# Patient Record
Sex: Female | Born: 1985 | Race: Black or African American | Hispanic: No | Marital: Married | Smoking: Never smoker
Health system: Southern US, Community
[De-identification: ages and names within clinical notes are randomized; demographics above are authoritative.]

## PROBLEM LIST (undated history)

## (undated) DIAGNOSIS — D573 Sickle-cell trait: Secondary | ICD-10-CM

## (undated) DIAGNOSIS — E0590 Thyrotoxicosis, unspecified without thyrotoxic crisis or storm: Secondary | ICD-10-CM

## (undated) HISTORY — DX: Sickle-cell trait: D57.3

## (undated) HISTORY — DX: Thyrotoxicosis, unspecified without thyrotoxic crisis or storm: E05.90

## (undated) HISTORY — PX: TUBAL LIGATION: SHX77

---
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
## 2004-01-14 ENCOUNTER — Emergency Department (HOSPITAL_COMMUNITY): Admission: EM | Admit: 2004-01-14 | Discharge: 2004-01-14 | Payer: Self-pay | Admitting: Emergency Medicine

## 2004-09-09 ENCOUNTER — Other Ambulatory Visit: Admission: RE | Admit: 2004-09-09 | Discharge: 2004-09-09 | Payer: Self-pay | Admitting: Gynecology

## 2004-10-17 ENCOUNTER — Emergency Department (HOSPITAL_COMMUNITY): Admission: EM | Admit: 2004-10-17 | Discharge: 2004-10-17 | Payer: Self-pay | Admitting: Emergency Medicine

## 2004-10-21 ENCOUNTER — Observation Stay (HOSPITAL_COMMUNITY): Admission: AD | Admit: 2004-10-21 | Discharge: 2004-10-22 | Payer: Self-pay | Admitting: Obstetrics and Gynecology

## 2005-04-29 ENCOUNTER — Ambulatory Visit: Payer: Self-pay | Admitting: Obstetrics & Gynecology

## 2005-04-29 ENCOUNTER — Ambulatory Visit (HOSPITAL_COMMUNITY): Admission: RE | Admit: 2005-04-29 | Discharge: 2005-04-29 | Payer: Self-pay | Admitting: Obstetrics and Gynecology

## 2005-05-01 ENCOUNTER — Inpatient Hospital Stay (HOSPITAL_COMMUNITY): Admission: AD | Admit: 2005-05-01 | Discharge: 2005-05-03 | Payer: Self-pay | Admitting: *Deleted

## 2005-09-22 ENCOUNTER — Other Ambulatory Visit: Admission: RE | Admit: 2005-09-22 | Discharge: 2005-09-22 | Payer: Self-pay | Admitting: Obstetrics and Gynecology

## 2006-09-28 ENCOUNTER — Other Ambulatory Visit: Admission: RE | Admit: 2006-09-28 | Discharge: 2006-09-28 | Payer: Self-pay | Admitting: Obstetrics and Gynecology

## 2007-08-11 ENCOUNTER — Ambulatory Visit (HOSPITAL_COMMUNITY): Admission: RE | Admit: 2007-08-11 | Discharge: 2007-08-11 | Payer: Self-pay | Admitting: Obstetrics and Gynecology

## 2008-02-09 ENCOUNTER — Inpatient Hospital Stay (HOSPITAL_COMMUNITY): Admission: AD | Admit: 2008-02-09 | Discharge: 2008-02-09 | Payer: Self-pay | Admitting: Obstetrics and Gynecology

## 2008-02-15 ENCOUNTER — Inpatient Hospital Stay (HOSPITAL_COMMUNITY): Admission: AD | Admit: 2008-02-15 | Discharge: 2008-02-18 | Payer: Self-pay | Admitting: Obstetrics and Gynecology

## 2010-06-21 ENCOUNTER — Inpatient Hospital Stay (HOSPITAL_COMMUNITY): Admission: AD | Admit: 2010-06-21 | Discharge: 2010-06-21 | Payer: Self-pay | Admitting: Obstetrics and Gynecology

## 2010-07-13 ENCOUNTER — Encounter: Payer: Self-pay | Admitting: Endocrinology

## 2010-08-25 ENCOUNTER — Ambulatory Visit: Payer: Self-pay | Admitting: Endocrinology

## 2010-08-25 DIAGNOSIS — E0590 Thyrotoxicosis, unspecified without thyrotoxic crisis or storm: Secondary | ICD-10-CM | POA: Insufficient documentation

## 2010-08-25 LAB — CONVERTED CEMR LAB
Free T4: 0.67 ng/dL (ref 0.60–1.60)
TSH: 0.28 microintl units/mL — ABNORMAL LOW (ref 0.35–5.50)

## 2010-12-17 ENCOUNTER — Inpatient Hospital Stay (HOSPITAL_COMMUNITY)
Admission: AD | Admit: 2010-12-17 | Discharge: 2010-12-20 | Payer: Self-pay | Source: Home / Self Care | Attending: Obstetrics and Gynecology | Admitting: Obstetrics and Gynecology

## 2010-12-19 ENCOUNTER — Encounter (INDEPENDENT_AMBULATORY_CARE_PROVIDER_SITE_OTHER): Payer: Self-pay | Admitting: Obstetrics and Gynecology

## 2010-12-21 LAB — CBC
HCT: 28.5 % — ABNORMAL LOW (ref 36.0–46.0)
Hemoglobin: 9.6 g/dL — ABNORMAL LOW (ref 12.0–15.0)
MCH: 23.8 pg — ABNORMAL LOW (ref 26.0–34.0)
MCHC: 33.7 g/dL (ref 30.0–36.0)
MCV: 70.5 fL — ABNORMAL LOW (ref 78.0–100.0)
Platelets: 212 10*3/uL (ref 150–400)
RBC: 4.04 MIL/uL (ref 3.87–5.11)
RDW: 16.3 % — ABNORMAL HIGH (ref 11.5–15.5)
WBC: 8.8 10*3/uL (ref 4.0–10.5)

## 2010-12-21 LAB — RPR: RPR Ser Ql: NONREACTIVE

## 2010-12-22 LAB — CBC
HCT: 27.5 % — ABNORMAL LOW (ref 36.0–46.0)
Hemoglobin: 9.1 g/dL — ABNORMAL LOW (ref 12.0–15.0)
MCH: 23.9 pg — ABNORMAL LOW (ref 26.0–34.0)
MCHC: 33.1 g/dL (ref 30.0–36.0)
MCV: 72.2 fL — ABNORMAL LOW (ref 78.0–100.0)
Platelets: 158 10*3/uL (ref 150–400)
RBC: 3.81 MIL/uL — ABNORMAL LOW (ref 3.87–5.11)
RDW: 15.9 % — ABNORMAL HIGH (ref 11.5–15.5)
WBC: 14.1 10*3/uL — ABNORMAL HIGH (ref 4.0–10.5)

## 2010-12-22 LAB — ABO/RH: ABO/RH(D): A POS

## 2010-12-24 NOTE — Op Note (Addendum)
  NAMESULMA, RUFFINO                  ACCOUNT NO.:  [id number]  MEDICAL RECORD NO.:  [id number]          PATIENT TYPE:  INP  LOCATION:  9108                          [facility]  [facility]  PHYSICIAN:  Hal Morales, M.D.DATE OF BIRTH:  [birth year]  DATE OF PROCEDURE:  12/19/2010 DATE OF DISCHARGE:                              OPERATIVE REPORT   PREOPERATIVE DIAGNOSES:  Desire for surgical sterilization, status post twin vaginal birth after cesarean section.  POSTOPERATIVE DIAGNOSES:  Desire for surgical sterilization, status post twin vaginal birth after cesarean section.  OPERATION:  Postpartum tubal sterilization.  SURGEON:  Vanessa P. Haygood, MD  ANESTHESIA:  Epidural.  ESTIMATED BLOOD LOSS:  Less than 10 mL.  COMPLICATIONS:  None.  FINDINGS:  The tubes appeared normal for the postpartum state.  DESCRIPTION OF PROCEDURE:  The patient was taken to the operating room after appropriate identification and placed on the operating table. After dosing her labor epidural for surgical anesthesia she was left in the supine position.  The abdomen was prepped with ChloraPrep and the perineum prepped with Betadine for insertion of a Foley catheter under sterile conditions.  The abdomen was draped as sterile field.  After assurance of adequate anesthesia, the subumbilical region was injected with 10 mL of 0.25% Marcaine.  A subumbilical incision was made and the peritoneum entered bluntly.  The left fallopian tube was identified, followed to its fimbriated end, then grasped at the isthmic portion and elevated.  A suture of 2-0 chromic was placed through the mesosalpinx and tied fore-and-aft on the knuckle of tube.  A second ligature was placed proximal to that and the intervening knuckle of tube was excised. The cut ends were cauterized.  A similar procedure was carried out on the opposite side.  Hemostasis was noted to be adequate.  The abdominal peritoneum and fascia were closed in a  single layer with a running suture of 0 Vicryl.  The skin incision was closed with a subcuticular suture of 3-0 Vicryl.  A sterile dressing was applied and the patient was taken from the operating room to the recovery room in satisfactory condition having tolerated the procedure well with sponge and instrument counts correct.  SPECIMENS TO PATHOLOGY:  Portions of right and left fallopian tube.     Hal Morales, M.D.     VPH/MEDQ  D:  12/19/2010  T:  12/19/2010  Job:  [id number]  Electronically Signed by Dierdre Forth M.D. on 12/24/2010 03:31:26 PM

## 2010-12-29 NOTE — Letter (Signed)
Summary: [employer] for [facility]   Imported By: Sherian Rein 08/28/2010 13:03:47  _____________________________________________________________________  External Attachment:    Type:   Image     Comment:   External Document

## 2010-12-29 NOTE — Assessment & Plan Note (Signed)
Summary: NEW ENDO CON/ [employer] / HYPOTHYROID/ LOW TSH/NWS  #   Vital Signs:  Patient profile:   25 year old female LMP:     04/08/2010 Height:      66 inches (167.64 cm) Weight:      214 pounds (97.27 kg) BMI:     34.67 O2 Sat:      98 % on Room air Temp:     98.3 degrees F (36.83 degrees C) oral Pulse rate:   125 / minute BP sitting:   110 / 72  (left arm) Cuff size:   large  Vitals Entered By: Brenton Grills MA (August 25, 2010 3:55 PM)  O2 Flow:  Room air CC: New Endo/Low TSH/Hypothyroidism/Dr. Rivard/aj LMP (date): 04/08/2010     Enter LMP: 04/08/2010   Referring Provider:  Silverio Lay MD Primary Provider:  Maryelizabeth Rowan MD  CC:  New Endo/Low TSH/Hypothyroidism/Dr. Rivard/aj.  History of Present Illness: pt was dx'ed with mild hyperthyroidism approx 2006, during a pregnancy.  as it was mild, she did not require any treatment.  she was followed for this also during another pregnancy in 2009, and did not require any treatment then, either.   she is now at 19+6 gestation,  and pt says this pregnancy is going well.  early in the pregnancy, she lost 12 lbs, and has since regained that many. n/v has improved, but persists.  Current Medications (verified): 1)  Promethazine Hcl 25 Mg Tabs (Promethazine Hcl) .Marland Kitchen.. 1 Tablet Every 6 Hours As Needed 2)  Zofran 4 Mg Tabs (Ondansetron Hcl) .Marland Kitchen.. 1 Tablet Every 4 Hours As Needed  Allergies (verified): No Known Drug Allergies  Past History:  Past Medical History: HYPERTHYROIDISM (ICD-242.90) FAMILY HISTORY DIABETES 1ST DEGREE RELATIVE (ICD-V18.0)  Past Surgical History: Caesarean section-2009  Family History: Reviewed history and no changes required. Family History of Arthritis (Grandparent) Family History Hypertension (Grandparent) Family History Diabetes 1st degree relative (Parent, Grandparent, Other Blood Relative) thyroid: none  Social History: Reviewed history and no changes required. 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  she has fatigue   Physical Exam  General:  obese.  no distress  Head:  head: no deformity eyes: no periorbital swelling, no proptosis external nose and ears are normal mouth: no lesion seen  Mouth:  Oropharynx is clear; good dentition, with no thrush or active dental infection.  Neck:  thyroid is perhaps slightly and diffusely enlarged.  no nodule Lungs:  Clear to auscultation bilaterally. Normal respiratory effort.  Heart:  tachycardic rate and normal rhythm without murmurs or gallops noted. Normal S1,S2.   Abdomen:  abdomen is soft, nontender.  no hepatosplenomegaly.   not distended.  no hernia obese Msk:  muscle bulk and strength are grossly normal.  no obvious joint swelling.  gait is normal and steady  Pulses:  dorsalis pedis intact bilat.  Extremities:  no deformity.  no ulcer on the feet.  feet are of normal color and temp.  no edema  Neurologic:  cn 2-12 grossly intact.   readily moves all 4's.   sensation is intact to touch on the feet no tremor Skin:  normal texture and temp.  no rash.  not diaphoretic  Cervical Nodes:  No significant adenopathy.  Psych:  Alert and cooperative; normal mood  and affect; normal attention span and concentration.   Additional Exam:  outside test results are reviewed:  tsh=0.004 (approx late aug 2011).    today: FastTSH              [L]  0.28 uIU/mL                 0.35-5.50 Free T4                   0.67 ng/dL         Impression & Recommendations:  Problem # 1:  HYPERTHYROIDISM (ICD-242.90) Assessment Improved  Problem # 2:  pregnancy so we'll have to watch #1 carefully  Problem # 3:  nausea could have been exac by #1  Medications Added to  Medication List This Visit: 1)  Promethazine Hcl 25 Mg Tabs (Promethazine hcl) .Marland Kitchen.. 1 tablet every 6 hours as needed 2)  Zofran 4 Mg Tabs (Ondansetron hcl) .Marland Kitchen.. 1 tablet every 4 hours as needed  Other Orders: TLB-TSH (Thyroid Stimulating Hormone) (84443-TSH) TLB-T4 (Thyrox), Free [phone]) Consultation Level IV (40981)  Patient Instructions: 1)  cc drs rivard and dewey.  2)  blood tests are being ordered for you today.  please call [phone] to hear your test results. 3)  Please schedule a follow-up appointment in 1 month. 4)  (update: i left message on phone-tree:  no rx needed now. ret 2 weeks.)

## 2010-12-29 NOTE — Discharge Summary (Signed)
  NAMEJODILYN, [name]                  ACCOUNT NO.:  [id number]  MEDICAL RECORD NO.:  [id number]          PATIENT TYPE:  INP  LOCATION:  9108                          [facility]  [facility]  PHYSICIAN:  Janine Limbo, M.D.DATE OF BIRTH:  [birth year]  DATE OF ADMISSION:  12/17/2010 DATE OF DISCHARGE:  12/20/2010                              DISCHARGE SUMMARY   ADMISSION DIAGNOSES: 1. A 36 week dichorionic diamniotic intrauterine pregnancy. 2. Preterm premature rupture of membranes. 3. Group B strep positive. 4. Desire sterilization. 5. Anemia.  DISCHARGE DIAGNOSES: 1. A 36 week dichorionic diamniotic intrauterine pregnancy. 2. Preterm premature rupture of membranes. 3. Group B strep positive. 4. Desire sterilization. 5. Anemia.  HOSPITAL PROCEDURES: 1. On December 18, 2010, the patient had a normal spontaneous     vaginal delivery of vertex vertex live born female infants.  Baby A     was 5 pounds 14 ounces born at 21:41, Apgars 8 and 9.  Baby B was 5     pounds 11 ounces born at 21:35 with Apgars of 8 and 9.  Placenta     was sent to Pathology. 2. Laparoscopic tubal sterilization performed on December 19, 2010 by     Dierdre Forth.  Portions of the right and left fallopian tube     were sent to Pathology.   HOSPITAL COUSRE: The patient had an uneventful course.  Her pain was  well-controlled with ibuprofen and Percocet.  She remained afebrile with stable vital signs throughout her stay.  Labs were as follows.  Her preop CBC showed white blood cell count of 8.8, hemoglobin of 9.6, hematocrit of 28.5, platelets of 212,000. Postpartum CBC showed white blood cell count of 14.1, hemoglobin of 9.1, hematocrit of 27.5, platelet count of 158,000.  RPR was nonreactive. Blood type A positive.  The patient was discharged home in stable condition on December 20, 2010, but did stay in the hospital as a baby-patient due to one of the babies experiencing jaundice and being under bili lights. She  was breast-feeding both babies well.  Both babies were circumcised.  DISCHARGE INSTRUCTIONS:  Per [facility] OB[facility] handout.  DISCHARGE MEDICATIONS:  Ibuprofen 600 mg p.o. q.6 p.r.n. for pain, dispensed 36 with 1 refill and Percocet 5/325 mg tablets 1-2 p.o. q.4 p.r.n. for pain, dispensed 36 with no refills.  The patient was encouraged to use stool softener while taking Percocet.  She was instructed to continue over-the- counter ferrous sulfate twice a day and increase dietary iron and fluids.  DISCHARGE FOLLOWUP:  At [facility] in 6 weeks or p.r.n.     Dorathy Kinsman, CNM   ______________________________ Janine Limbo, M.D.    VS/MEDQ  D:  12/24/2010  T:  12/24/2010  Job:  [id number]  Electronically Signed by Dorathy Kinsman  on 12/27/2010 04:38:35 PM Electronically Signed by Kirkland Hun M.D. on 12/29/2010 12:00:46 PM

## 2010-12-31 ENCOUNTER — Inpatient Hospital Stay: Admission: RE | Admit: 2010-12-31 | Payer: Self-pay | Source: Home / Self Care | Admitting: Obstetrics and Gynecology

## 2011-02-13 LAB — TSH: TSH: <0.004 u[IU]/mL — ABNORMAL LOW (ref 0.350–4.500)

## 2011-02-13 LAB — URINALYSIS, ROUTINE W REFLEX MICROSCOPIC
Bilirubin Urine: NEGATIVE
Glucose, UA: NEGATIVE mg/dL
Ketones, ur: NEGATIVE mg/dL
Nitrite: NEGATIVE
Protein, ur: NEGATIVE mg/dL
Specific Gravity, Urine: 1.015 (ref 1.005–1.030)
Urobilinogen, UA: 2 mg/dL — ABNORMAL HIGH (ref 0.0–1.0)
pH: 6.5 (ref 5.0–8.0)

## 2011-02-13 LAB — URINE MICROSCOPIC-ADD ON

## 2011-02-13 LAB — COMPREHENSIVE METABOLIC PANEL
ALT: 22 U/L (ref 0–35)
AST: 20 U/L (ref 0–37)
Albumin: 2.9 g/dL — ABNORMAL LOW (ref 3.5–5.2)
Alkaline Phosphatase: 82 U/L (ref 39–117)
BUN: 4 mg/dL — ABNORMAL LOW (ref 6–23)
CO2: 27 mEq/L (ref 19–32)
Calcium: 10.6 mg/dL — ABNORMAL HIGH (ref 8.4–10.5)
Chloride: 101 mEq/L (ref 96–112)
Creatinine, Ser: 0.57 mg/dL (ref 0.4–1.2)
GFR calc Af Amer: >60 mL/min (ref >60)
GFR calc non Af Amer: >60 mL/min (ref >60)
Glucose, Bld: 174 mg/dL — ABNORMAL HIGH (ref 70–99)
Potassium: 3.1 mEq/L — ABNORMAL LOW (ref 3.5–5.1)
Sodium: 134 mEq/L — ABNORMAL LOW (ref 135–145)
Total Bilirubin: 0.6 mg/dL (ref 0.3–1.2)
Total Protein: 6.2 g/dL (ref 6.0–8.3)

## 2011-02-13 LAB — URINE CULTURE
Colony Count: 30000
Culture  Setup Time: 201107242157

## 2011-04-13 NOTE — H&P (Signed)
NAMEQUANTA, ROBERTSHAW                  ACCOUNT NO.:  [id number]   MEDICAL RECORD NO.:  [id number]          PATIENT TYPE:  INP   LOCATION:  9164                          [facility]  [facility]   PHYSICIAN:  Hal Morales, M.D.DATE OF BIRTH:  06-18-1986   DATE OF ADMISSION:  02/15/2008  DATE OF DISCHARGE:                              HISTORY & PHYSICAL   [name] is a 25 year old gravida 3, para 1-0-1-1 at 39-3/7 weeks who  presented complaining of spontaneous ruptured membranes at 8 a.m. with  clear fluid noted and uterine contractions every 3 to 4 minutes  subsequently.  She reports positive fetal movement.   PREGNANCY HISTORY:  1. History of hypothyroidism, but no medications used this pregnancy.      Her last TSH and free T4 were done last week. These have been      within normal limits.  2. Positive group B strep.  3. History of polyhydramnios on ultrasound with AFI 28.5.  4. Estimated fetal weight 8 pounds 5 ounces on ultrasound on March 6.  5. Positive sickle cell trait, father and baby negative.  6. Rubella equivocal.   PRENATAL LABS:  Blood type is A+, rh antibody negative, viral titer  equivocal.  Hepatitis C surface antigen negative.  HIV nonreactive.  Sickle cell test was positive for trait.  Cystic fibrosis testing was  negative.  GC and chlamydia cultures were negative at her first visit.  Hemoglobin 23, creatinine 12.7.  AST normal.  TSH 0.310 at approximately  17 weeks.  T3 and free T4 were done at 19 weeks.  Repeated again at 29  weeks.  TSH was 0.369, T3 was 280, and T4 was 1.14.  Glucola was normal.  TSH was done at 33 weeks.  Group B strep was positive at 36 weeks.  Free  T4 on March 13 was 1.15 and TSH was 0.317.  Quadruple screen was  declined.   HISTORY OF PRESENT PREGNANCY:  The patient entered care at approximately  10 weeks.  She was treated for BV at that time and nausea.  She declined  quadruple screen.  TSH was 0.310 at 17 weeks, rubella equivocal with a  plan to vaccinate postpartum.  Ultrasound at 19 weeks showed normal  growth with normal fluid.  She was treated again for BV at 23 weeks and  also for nausea.  Free T4 at 19 weeks was normal.  T3 was 305 at 19  weeks.  Glucola was done and was normal.  She had another ultrasound at  29 weeks showing growth at the 85 percentile and normal fluid.  TSH, T4,  and T3 were done again at 29 weeks.  TSH was 0.369, T3 was 280, and T4  was 1.14.  Dr. Talmage Nap was following the patient for her hypothyroidism.  She had another ultrasound at 35 weeks, showing growth was 80 percentile  and normal fluid.  She was treated for a UTI at that time with Macrobid.  Group B strep culture was done at 37 weeks and was positive.  Ultrasound  at 37 weeks on March 6,  was 8 pounds 5 ounces with polyhydramnios noted  and AFI 28.5.  Induction of labor was scheduled on March 20 with Dr.  Estanislado Pandy at 6 a.m.  The patient was seen at the office on March 13.  She  had some glycosuria.  Blood sugar 150.  NST was not reactive.  Fetal  heart rate was noted to be greater than 200, NST was nonreactive with a  fetal heart rate of 160 and no variability.  The patient was sent to  maternal admissions unit for prolonged monitoring, labs, a TSH, and a  free T4.  While in MAU, the patient was noted to have a reactive NON-  stress test also had a negative spontaneous CST.  The cervix, at that  time, had been 3 cm and 80%.  It did not change during the time she was  in the maternity admissions unit.  She was seen on March 18 at 39-2/7  weeks, cervix was unchanged.  Her TSH from March 13 was 0.317, and T4  was 1.15.  The patient then presented today with the previously noted  history of ruptured membranes.   OBSTETRICAL HISTORY:  In 2004, she had a termination of pregnancy.  In  2006, she had a vaginal birth of female infant weight 6 pounds 11 ounces  at 40 weeks.  She was in labor 8 hours.  She had no pain medication.  She had hyperthyroidism  and hyperkalemia during that pregnancy.  She  also had some hyperemesis during that pregnancy.   MEDICAL HISTORY:  She had an abnormal Pap smear in 2005, repeat was  normal.  Her only surgery was the previously noted TAB.  The patient has  no known drug allergies.  The patient has sickle cell trait.  She also  was diagnosed with hyperthyroidism in 2006 and was followed by Dr.  Talmage Nap.  She does have a history of frequent UTI.  The patient has not  required any hyperthyroidism medication.  She was a smoker until 2004.   FAMILY HISTORY:  Maternal uncle had an MI, maternal grandmother had  chronic hypertension.  Maternal grandmother and paternal grandmother had  varicosities.  Her father had non-insulin-dependent diabetes.  Maternal  grandfather and maternal uncle have the same.   GENETIC HISTORY:  Remarkable for the patient with sickle cell trait.  Father of the baby is negative.   SOCIAL HISTORY:  The patient is married to the father of the baby.  He  is involved in support.  Name is Lerin Jech.  The patient is African-  American.  She denies any religious affiliation.  She has a high school  education.  She is Environmental health practitioner.  Her partner also has a  high school education.  He is employed in transportation.  She has been  followed by the physician service at [facility].  She denies  any alcohol, drug, or tobacco use during pregnancy.   PHYSICAL EXAM:  VITAL SIGNS:  Stable.  The patient is afebrile.  HEENT:  Within normal limits.  LUNGS:  Breath sounds are clear.  HEART:  Regular rate and rhythm without murmur.  BREASTS:  Soft and nontender.  ABDOMEN:  Fundal height is approximately 39 cm.  Estimated fetal weight  is 8 to 8-1/2 pounds.  Uterine contractions every 3 to 3-1/2 minutes.  Moderate quality.  Cervix is 4-5, 80% vertex at a -2 station.  The  patient is leaking clear fluid.  Uterine contractions every 3 to 4  minutes.  EXTREMITIES:  Deep tendon reflexes  are 2+ without clonus.  There is a  trace edema noted.   Fetal monitoring shows fetal heart rate is 160-170 and nonreactive.  There are occasional mild variables noted.   IMPRESSION:  1. Uterine pregnancy at 39-3/7 weeks.  2. Positive group B strep.  3. Hyperthyroidism with no medications during this pregnancy.  4. Sickle cell trait.  5. Fetal tachycardia.  6. Spontaneous rupture of membranes.   PLAN:  1. Admit to birthing, consult with Dr. Pennie Rushing, attending physician.  2. Routine physician orders.  3. The patient prefers IV pain medication should she desire medication      at all.  4. Will monitor fetal heart rate status closely secondary to episodes      of significant tachycardia.      Renaldo Reel Emilee Hero, C.N.M.      Hal Morales, M.D.  Electronically Signed    VLL/MEDQ  D:  02/15/2008  T:  02/15/2008  Job:  [id number]

## 2011-04-13 NOTE — Op Note (Signed)
NAMESAHILY, BIDDLE                  ACCOUNT NO.:  [id number]   MEDICAL RECORD NO.:  [id number]          PATIENT TYPE:  INP   LOCATION:  9130                          [facility]   PHYSICIAN:  Hal Morales, M.D.DATE OF BIRTH:  [birth year]   DATE OF PROCEDURE:  02/15/2008  DATE OF DISCHARGE:                               OPERATIVE REPORT   PREOPERATIVE DIAGNOSIS:  Intrauterine pregnancy at term, spontaneous  rupture of membranes, polyhydramnios, failure to progress in labor.   POSTOPERATIVE DIAGNOSES:  1. Intrauterine pregnancy at term, spontaneous rupture of membranes,      polyhydramnios, failure to progress in labor.  2. Fetal macrosomia and loose nuchal cord.   PROCEDURE:  Primary low transverse cesarean section.   SURGEON:  Dr. Dierdre Forth.   FIRST ASSISTANT:  Wynelle Bourgeois.   ANESTHESIA:  Spinal.   ESTIMATED BLOOD LOSS:  750 mL.   COMPLICATIONS:  None.   FINDINGS:  The patient was delivered of a female infant whose name is  [facility], weighing 8 pounds 14 ounces with a Apgars of 9 and 9 at  one and five minutes respectively.   PROCEDURE:  The patient was taken to the operating room after  appropriate identification and placed on the operating table.  A  discussion had been held with the parents concerning the indications for  the procedure and the risks involved which include but are not limited  to anesthesia, bleeding, infection and damage to adjacent organs.  In  the operating room the patient had a spinal anesthetic placed and was  placed in the supine position with a left lateral tilt.  The abdomen and  perineum were prepped with multiple layers of Betadine and a Foley  catheter inserted into the bladder under sterile conditions then  connected to straight drainage.  The abdomen was draped as a sterile  field.  After assurance of adequate surgical anesthesia, the suprapubic  region was infiltrated with 20 mL of 0.25% Marcaine.  A suprapubic  incision was made and the abdomen opened in layers.  The peritoneum was  entered and the bladder blade placed.  The uterus was incised  approximately 2 cm above the uterovesical fold and that incision taken  laterally on either side bluntly.  The infant was delivered from the  occiput transverse position and after reduction a loose nuchal cord,  head, nares and pharynx suctioned and the cord clamped and cut.  Infant  was then handed off to the waiting pediatricians.  The appropriate cord  blood was drawn and the placenta allowed to separate from the uterus  then removed from the operative field.  The uterine incision was closed  with a running interlocking suture of 0 Vicryl.  An imbricating suture  of 0 Vicryl was then placed with adequate hemostasis.  Copious  irrigation was carried out and the abdominal peritoneum closed with a  running suture of 2-0 Vicryl.  The rectus muscles were reapproximated in  midline with a figure-of-eight suture of 2-0 Vicryl.  The rectus fascia  was closed with a running suture of 0 Vicryl,  then reinforced on either  side of midline with figure-of-eight sutures of 0 Vicryl.  The  subcutaneous tissue was irrigated and made hemostatic with Bovie  cautery.  A subcutaneous Jackson-Pratt drain was placed through a stab  wound in the left lower quadrant.  It was sewn in place with a suture of  0 silk.  The skin incision was then closed with a subcuticular suture of  3-0 Monocryl.  Steri-Strips were applied and a sterile dressing applied.  The patient was taken from the operating room to the recovery room in  satisfactory condition having tolerated the procedure well with sponge  and instrument counts correct.  The infant went to the full-term  nursery.      Hal Morales, M.D.  Electronically Signed     VPH/MEDQ  D:  02/16/2008  T:  02/16/2008  Job:  [id number]

## 2011-04-13 NOTE — Discharge Summary (Signed)
[name], [name]                  ACCOUNT NO.:  [id number]   MEDICAL RECORD NO.:  [id number]          PATIENT TYPE:  INP   LOCATION:  9130                          [facility]   PHYSICIAN:  Crist Fat. Rivard, M.D. DATE OF BIRTH:  August 20, 1986   DATE OF ADMISSION:  02/15/2008  DATE OF DISCHARGE:  02/18/2008                               DISCHARGE SUMMARY   ADMITTING DIAGNOSES:  1. Intrauterine pregnancy at 39-3/7th weeks.  2. Early labor.  3. History of hypothyroidism, but no medications.  4. Positive group B Strep.  5. History of polyhydramnios.  6. Estimated fetal weight 8 pounds 5 ounces.  7. Positive sickle cell trait.  8. Rubella equivocal.    On admission, the patient noted to be leaking clear fluids, cervix was 4-  5, 80%, vertex at -2 station.  She was having some fetal tachycardia.  The patient was admitted to Dr. Lilian Coma care.  She progressed to 10 cm  at 7:50 in the evening, fetus was 0 to +1 station.  She began pushing.  The patient pushed for 2 hours, but made no change in descent.  The  patient then was offered C-section and this was consented to.  The  patient was taken to the operating room, where Dr. Pennie Rushing performed a  primary low-transverse cesarean section under spinal anesthesia.  Findings were a viable female by the name of Johny Shears.  Weight 8  pounds 14 ounces, Apgars were 9 and 9.  There was a nuchal cord noted.  The infant was taken to the full-term nursery.  Mother was taken to  recovery in good condition.  By postop day 1, the patient was doing  well.  She was up ad lib.  Hemoglobin was 9.3 down from 11.4, white  blood cell count was 18.9, and platelet count was 274.  Her incision was  clean, dry, and intact with subcuticular sutures and Steri-Strips noted.  Rest of her hospital course was uncomplicated.  She was breast-feeding.  She did have a JP drain, it drained lesser amounts each day.  By postop  day 3, she was doing well, she was up ad  lib.  She was planning  NuvaRing, if she was not breast-feeding at 6 weeks.  Her incision was  clean, dry, and intact.  Her JP drain had minimal drainage and was  removed without difficulty.  Incision was clean, dry, and intact.  Fundus was firm.  Lochia was scant.  The infant was doing well at the  breast.  The patient was deemed to receive full benefit of her hospital  stay and was discharged home.   DISCHARGE INSTRUCTIONS:  Per [facility] handout.   DISCHARGE MEDICATIONS:  1. Motrin 600 mg p.o. q.6 h. p.r.n. pain.  2. Percocet 5/325 one to two p.o. q.3-4 h. p.r.n. pain.   DISCHARGE FOLLOWUP:  Will occur in 6 weeks at [facility].      Renaldo Reel Emilee Hero, C.N.M.      Crist Fat Rivard, M.D.  Electronically Signed    VLL/MEDQ  D:  02/18/2008  T:  02/18/2008  Job:  [id number]

## 2011-04-16 NOTE — H&P (Signed)
NAMEMAJESTIC, MOLONY NO.:  [id number]   MEDICAL RECORD NO.:  [id number]          PATIENT TYPE:  INP   LOCATION:  9319                          FACILITY:  [facility]   PHYSICIAN:  Osborn Coho, M.D.   DATE OF BIRTH:  Nov 03, 1986   DATE OF ADMISSION:  10/21/2004  DATE OF DISCHARGE:                                HISTORY & PHYSICAL   [name] is an 25 year old single black female who presents with vomiting  every day for a few weeks now with upper abdominal/epigastric pain.  She  denies fever, denies dysuria.  She had an emergency room visit at [facility] on November 19 and was treated for a urinary tract infection, was given  Macrobid and was also given a GI cocktail.  Her pulse during that visit was  documented to be between 130 and 150 beats per minute.   PAST OBSTETRICAL HISTORY:  She is a gravida 2, para 0-0-1-0.  She had an  elective AB in 2001.   PAST MEDICAL HISTORY:  Unremarkable.   PAST SURGICAL HISTORY:  Only the TAB.   FAMILY MEDICAL HISTORY:  Noncontributory.   OBJECTIVE:  VITAL SIGNS:  Stable.  Temperature is 97.2, pulse 136,  respirations 18, blood pressure 122/88.  Her orthostatic vital signs upon  arrival to [facility] were 122/88 and pulse was 136 lying; sitting  123/81 and pulse was 144; standing was 96/64 and pulse was 182.  At 5:20  a.m. her orthostatic, lying 142/81, pulse was 118; sitting 142/90, pulse was  120; and standing 132/78 and pulse was 128.  HEENT:  Grossly within normal limits.  CHEST:  Lungs are clear to auscultation.  CARDIAC:  Sinus tachycardia per EKG.  ABDOMEN:  Gravid at approximately 13 weeks' size.  There is no guarding and  no rebound.  PELVIC:  Deferred.  EXTREMITIES:  Within normal limits.   LABORATORY DATA:  Sodium is 131, potassium 3.4, SGOT 68, SGPT 96.  Her CBC  is within normal limits.  TSH is 0.011.  OB ultrasound shows a single  intrauterine pregnancy at 13 weeks 5 days with normal fluid, with EDC of  Apr 23, 2005.  Amylase, lipase, and free T4 are still pending.  Gallbladder  ultrasound is within normal limits.   ASSESSMENT:  1.  Intrauterine pregnancy at 13-5/7 weeks.  2.  Unexplained tachycardia.  3.  Epigastric pain, rule out pancreatitis.   PLAN:  1.  Admit the patient.  2.  NPO with IV hydration.  3.  Further plan will be determined when the lab work is completed.     Kimb   KS/MEDQ  D:  10/21/2004  T:  10/21/2004  Job:  [id number]

## 2011-04-16 NOTE — Discharge Summary (Signed)
[name], [name]                  ACCOUNT NO.:  [id number]   MEDICAL RECORD NO.:  [id number]          PATIENT TYPE:  INP   LOCATION:  9130                          [facility]ILITY:  [facility]   PHYSICIAN:  Osborn Coho, M.D.   DATE OF BIRTH:  1986-01-07   DATE OF ADMISSION:  05/01/2005  DATE OF DISCHARGE:  05/03/2005                                 DISCHARGE SUMMARY   ADMISSION DIAGNOSES:  1.  Intrauterine pregnancy at 41 2/7 weeks.  2.  Hyperthyroidism on PTU.  3.  Decelerations on fetal heart rate tracing.   DISCHARGE DIAGNOSES:  1.  Intrauterine pregnancy at 41 2/7 weeks.  2.  Nonreassuring fetal heart pattern.  3.  Thick meconium.  4.  Hyperthyroidism.  5.  Anemia.   PROCEDURES:  1.  Vacuum assisted vaginal birth.  2.  Pudendal anesthesia.   HOSPITAL COURSE:  [name] is a 25 year old gravida 2, para 0, 0-1-0 at 33  2/7 weeks, who was admitted on the morning of 05/01/05 complaining of possible  spontaneous rupture of membranes and early labor.  The patient's pregnancy  had been remarkable for:  1) Hyperthyroidism with the patient on PTU 50.  2)  History of hypokalemia.  3) Sickle cell trait.  4) Group B Strep negative.   On admission pulse was 108 with the first three contractions after the  patient's arrival at the hospital, she had deep variables versus lates with  each of the first three contractions.  Fetal heart rate baseline was 160-  170.  There was a small amount of meconium stained fluid noted.  The patient  was 3 cm, 90% vertex and -1 station.  An IV was started.  Position change  was accomplished and the fetal heart rate resolved to a stable baseline of  60-170 without late decelerations.  IUPC was inserted at that time secondary  to the previously noted rupture of membranes.  Thick meconium was noted.  Fetal heart remained reassuring throughout the rest of the labor process.  The patient became complete at 9:10 in the morning .  A pudendal block was  placed by Dr.  Estanislado Pandy.  Until this latter part of the second stage of labor,  fetal heart rate had remained reassuring.  At that time, repetitive severe  decelerations were noted.  The patient was consented for vacuum delivery and  the MICU team was present.  The baby was de Lee'd on the perineum.  The  vacuum assisted vaginal birth went very well.  A viable female by the name of  [name] was born at 9:40 a.m., Apgars were 8 and 9.  Weight was 6 pounds, 11  ounces.  The infant was taken to the full term nursery.  The mother was  taken to the recovery room in good condition.  There was no meconium noted  below the cords.  There were bilateral vulvar lacerations, which were  repaired.  By postoperative day #1, the patient was doing well.  She was up  ad lib with no syncope.  She was breast feeding.  Her hemoglobin was  8.9,  down from 11.  Her pulse rate remained in the 90s to 100s.  Her physical  exam was within normal limits.  Orthostatic blood pressures and pulses were  done and were within normal limits.  The patient declined blood transfusion.  By post partum day #2, the patient was again doing well.  She was tolerating  a regular diet and was having no episodes of dizziness of syncope.  She was  deemed to have received full benefit of her hospital stay and was discharged  home.  Discharge condition stable.   DISCHARGE INSTRUCTIONS:  Per [facility] hand out.   DISCHARGE MEDICATIONS:  Motrin 600 mg p.o. q.6h. p.r.n. pain.  Tylox 1-2 p.o. q.3-4h. p.r.n. pain.   The patient will consult with her endocrinologist regarding management of  her hyperthyroidism.  Discharge followup will occur in six weeks with  [facility].  The patient is also to contact her endocrinologist for  followup post partum based upon her hyperthyroidism.       VLL/MEDQ  D:  05/03/2005  T:  05/03/2005  Job:  [id number]

## 2011-04-16 NOTE — H&P (Signed)
NAMECHIRSTINA, HAAN                  ACCOUNT NO.:  [id number]   MEDICAL RECORD NO.:  [id number]          PATIENT TYPE:  INP   LOCATION:  WOC                           [facility]  [facility]   PHYSICIAN:  Crist Fat. Rivard, M.D.      DATE OF BIRTH:   DATE OF ADMISSION:  05/01/2005  DATE OF DISCHARGE:                                HISTORY & PHYSICAL   Ms. Louanne Skye is a 25 year old gravida 2, para 0, 0, 1, 0 at 41-1/7 weeks who  presented tonight in early labor. She has been 3 cm in the office. Her  pregnancy has been remarkable for:  1.  Hyperthyroidism diagnosed during this pregnancy with the patient on 50      of PTU daily.  2.  Questionable last menstrual period.  3.  History of hypokalemia.  4.  Abnormal TSH and LFTs in early pregnancy.  5.  Sickle cell trait.  6.  Group B strep culture negative.   PRENATAL LABS:  Blood type A positive. Rh antibody negative. VDRL  nonreactive. Rubella titer is non immune. Hepatitis B surface antigen is  negative. GC and Chlamydia cultures were negative. Pap was normal in  October. TSH was low at 0.141. Hemoglobin electrophoresis was positive.  Hemoglobin upon entering practice was 11.1, it was 10.9 at 22 weeks, and  10.7 at 26 weeks. Estimated date of confinement of Apr 22, 2005 was  established by a  13-week ultrasound and was in agreement with ultrasound at 18 weeks.  Quadruple screen was normal. Group B strep culture and other cultures were  negative at 36 weeks.   HISTORY OF PRESENT PREGNANCY:  The patient began care at approximately 15  weeks. She had an evaluation at maternity admissions for nausea and vomiting  prior to when she entered care. She had a low TSH noted at that time and  some elevation of liver function tests. These were repeated. Her hemoglobin  electrophoresis showed hemoglobin AS. Rubella was non immune. T4 was  elevated at 18.3, and TSH was diminished at 0.141. The patient's pulse  remained in the mid 90's to 110. She was seen by  the endocrinologist between  31 and 33 weeks and was diagnosed with hyperthyroidism, and was placed on  PTU 50 mg b.i.d. She had another ultrasound at 35 weeks showing normal  growth and development with weight at the 25th to 50th percentile. At 36  weeks her pulse was 132, at 37 weeks it was 95. She was given a prescription  for Labetalol at 36 weeks for elevated pulse rate, however, she never had to  use this. The rest of her pregnancy was essentially uncomplicated. TSH  levels were followed by Dr. Willeen Cass office. The patient also reported  possible spontaneous rupture of membranes at approximately 1:30 a.m. with  brown fluid noted.   OBSTETRICAL HISTORY:  In September of 2003 she had a therapeutic termination  of pregnancy.   PAST MEDICAL HISTORY:  She has been a previous condom and oral conceptive  user. She reports the usual childhood illnesses. She was admitted on  November 22 and 23 for IV fluids for dehydration.   ALLERGIES:  She has no known medication allergies.   FAMILY HISTORY:  Maternal grandmother had chronic hypertension. Paternal  grandmother and maternal grandmother had varicosities. Maternal grandfather  and paternal grandfather, maternal uncle and patient's father all had  diabetes.   GENETIC HISTORY:  Remarkable for the patient's maternal cousins and paternal  cousins being twins.   SOCIAL HISTORY:  The patient is single. The father of the baby is involved  and supportive. His name is Daje Stark. The patient has a high school  education. She is currently unemployed. The father of the baby has a high  school education. He is employed at a [employer]. She has been followed  initially by the certified nurse midwife service but then was transferred to  the physician's service in light of her hyperthyroidism.   PHYSICAL EXAMINATION:  VITAL SIGNS:  Blood pressure is 130/68, pulse is 108,  other vital signs are stable. Fetal heart rate shows a series of three  late  decelerations with the first three contractions on the monitor with a  baseline fetal heart rate of 160 to 170.  PELVIC:  Cervix is 3 cm, 80%, vertex at -1 station. The patient is noted to  be leaking meconium stained fluid. Uterine contractions are every 3 to 5  minutes, moderate quality.  EXTREMITIES:  Deep tendon reflexes are 2+ without clonus. There is trace  edema noted.  CHEST:  Clear.  HEART:  Regular rate and rhythm without murmur.  BREASTS:  Are soft and nontender.  ABDOMEN:  Fundal height is approximately 39 cm.   IMPRESSION:  1.  Intrauterine pregnancy at 41-1/7 weeks.  2.  Early labor.  3.  Spontaneous rupture of membranes with meconium stained fluid.  4.  Negative group B strep.  5.  Decelerations on fetal heart rate tracing.   PLAN:  1.  The patient is to be admitted to [facility] for      consult with Dr. Silverio Lay as attending physician.  2.  Dr. Estanislado Pandy is currently at the bedside at which point she arrived soon      after the patient's admission for further evaluation of her tracing. Dr.      Estanislado Pandy will anticipate close observation of maternal and fetal status.      If a non reassuring fetal heart rate pattern continues a cesarean will      be performed. Otherwise the patient will be admitted to the birthing      suite and an intrauterine pressure catheter placed with a probable scalp      lead for further intensive monitoring. The patient's pulse rate and      blood pressure will be monitored carefully in light of her history of      hyperthyroidism.      VLL/MEDQ  D:  05/01/2005  T:  05/01/2005  Job:  [id number]

## 2011-04-16 NOTE — Discharge Summary (Signed)
NAMEADALIDA, GARVER NO.:  [id number]   MEDICAL RECORD NO.:  [id number]          PATIENT TYPE:  INP   LOCATION:  9319                          [facility]  [facility]   PHYSICIAN:  Janine Limbo, M.D.DATE OF BIRTH:  06-15-1986   DATE OF ADMISSION:  10/21/2004  DATE OF DISCHARGE:  10/22/2004                                 DISCHARGE SUMMARY   ADMISSION DIAGNOSES:  1.  Intrauterine pregnancy at 13 5/7 weeks.  2.  Unexplained tachycardia.  3.  Epigastric pain, rule out pancreatitis.   DISCHARGE DIAGNOSES:  1.  Intrauterine pregnancy at 12 1/2 weeks.  2.  Resolved epigastric pain.  3.  Hypokalemia.   PROCEDURE:  None.   HOSPITAL COURSE:  [name] is an 25 year old, gravida 2, para 0-0-1-0 at 5  1/7 weeks who presented on October 21, 2004 with vomiting and upper  abdominal and epigastric pain.  She had an emergency room visit at [facility] on November 19 and was treated for a UTI, was given Macrobid and was  also given a GI cocktail.  On admission, she was afebrile, her pulse was  136. She had normal vital signs except for tachycardia.  Her potassium was  3.4, her SGOT was 68, SGPT 96.  OB ultrasound showed a single intrauterine  pregnancy at 13 weeks and 5 days.  She was given IV fluids, n.p.o. was  maintained. The pain began to improve with IV fluids.  TSH was noted to be  abnormal, however, free T4 was normal probably as a result of nausea and  vomiting.  She had normal stool, hepatitis profile was negative. She began  to eat the evening of November 23.  By the morning of November 24, she was  doing well, she tolerated a regular diet, had no vomiting or abdominal pain,  vital signs were stable, pulse was 102.  Hemoglobin was 9.6, white blood  cell count was 4.1, differential was normal.  Potassium was 2.3, all other  metabolic panels were normal. She was begun on potassium supplementation.  She had an ultrasound documenting a 13 5/[redacted] week gestation.  Dr.  Stefano Gaul was  consulted, he determined the patient was able to tolerate p.o. fluids and  food and was significantly approved. She was therefore discharged home.   DISCHARGE INSTRUCTIONS:  Per [facility] antenatal discharge  instructions.   MEDICATIONS:  1.  Phenergan 25 mg 1 p.o. q.6 h. p.r.n. nausea.  2.  K-Dur 40 mEq each day, 1 p.o. q.d. for 7 days.   DIET:  Bland diet with increased fluids.   ACTIVITY:  No restrictions.   SEXUAL ACTIVITY:  No restrictions.   FOLLOW UP:  Discharge followup will occur in one week at [facility]  or p.r.n.   CONDITION ON DISCHARGE:  Stable.     Vick   VLL/MEDQ  D:  10/22/2004  T:  10/23/2004  Job:  [id number]

## 2011-08-23 LAB — T4, FREE: Free T4: 1.15

## 2011-08-23 LAB — CBC
HCT: 33.9 — ABNORMAL LOW
HCT: 26.9 — ABNORMAL LOW
HCT: 33.2 — ABNORMAL LOW
Hemoglobin: 11.6 — ABNORMAL LOW
Hemoglobin: 11.4 — ABNORMAL LOW
Hemoglobin: 9.3 — ABNORMAL LOW
MCHC: 34.1
MCHC: 34.2
MCHC: 34.7
MCV: 76.3 — ABNORMAL LOW
MCV: 75.4 — ABNORMAL LOW
MCV: 76 — ABNORMAL LOW
Platelets: 327
Platelets: 274
Platelets: 305
RBC: 4.45
RBC: 3.56 — ABNORMAL LOW
RBC: 4.37
RDW: 16 — ABNORMAL HIGH
RDW: 16 — ABNORMAL HIGH
RDW: 16.1 — ABNORMAL HIGH
WBC: 8.3
WBC: 12.1 — ABNORMAL HIGH
WBC: 18.9 — ABNORMAL HIGH

## 2011-08-23 LAB — RPR: RPR Ser Ql: NONREACTIVE

## 2011-08-23 LAB — TSH: TSH: 0.317 — ABNORMAL LOW

## 2012-02-09 IMAGING — US US OB EACH ADDL GEST<[ID]
1 series · 14 of 24 positions shown · non-contrast
Comparison: None

CLINICAL DATA: Hyper emesis

OBSTETRIC <14 WK US AND TRANSVAGINAL OB US
TECHNIQUE: Both transabdominal and transvaginal ultrasound
examinations were performed for complete evaluation of the
gestation as well as the maternal uterus, adnexal regions, and
pelvic cul-de-sac.  Transvaginal technique was performed to assess
early pregnancy.

[Series 1: us ob comp less 14 wks · 0.18mm/px · 24 acquisitions, 14 frames shown]
[im 1/24]
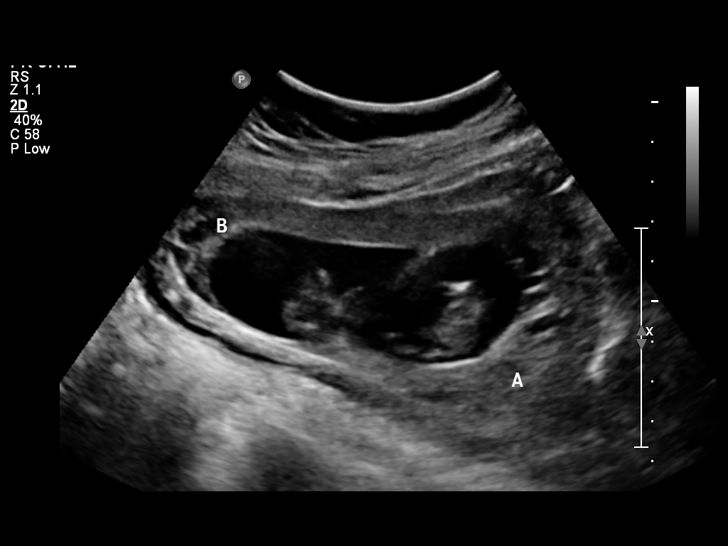
[im 3/24]
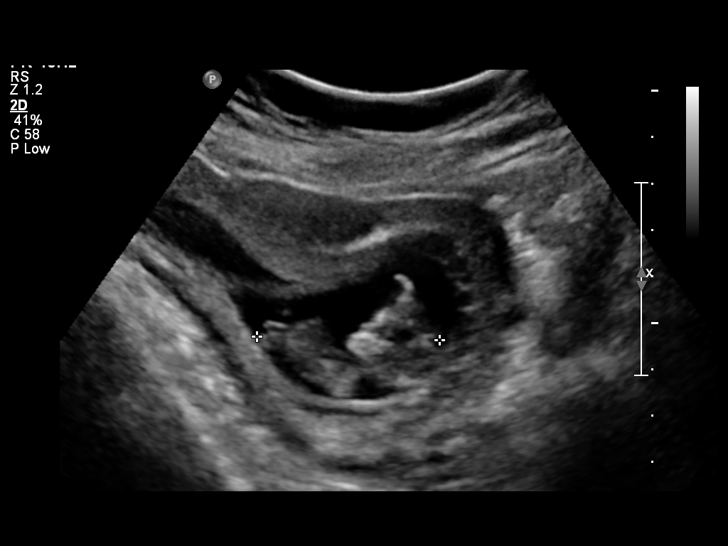
[im 5/24]
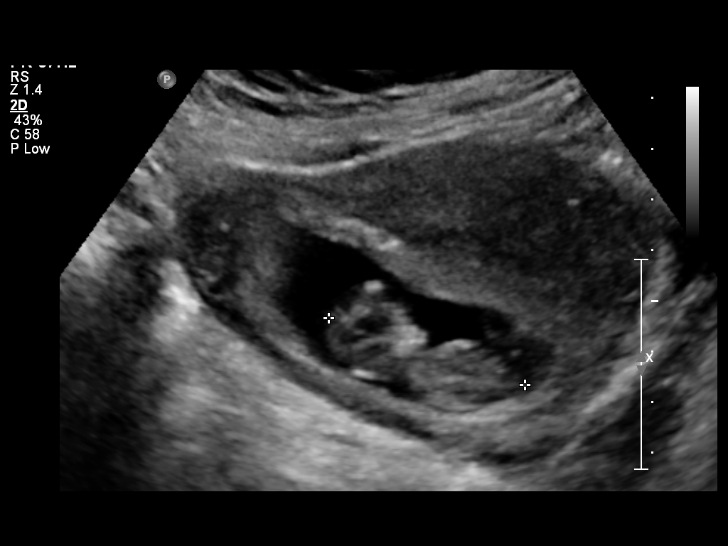
[im 7/24]
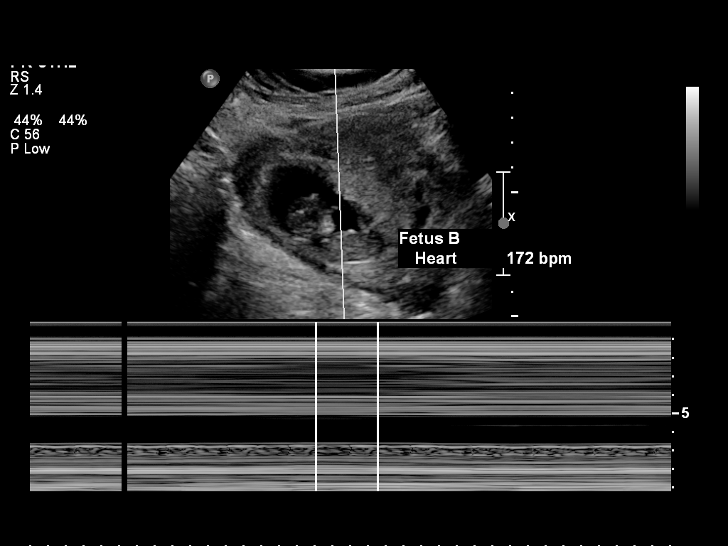
[im 8/24]
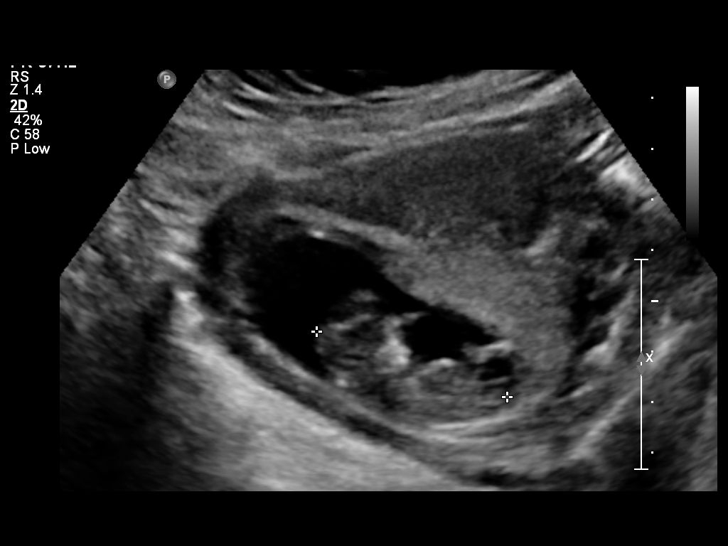
[im 10/24]
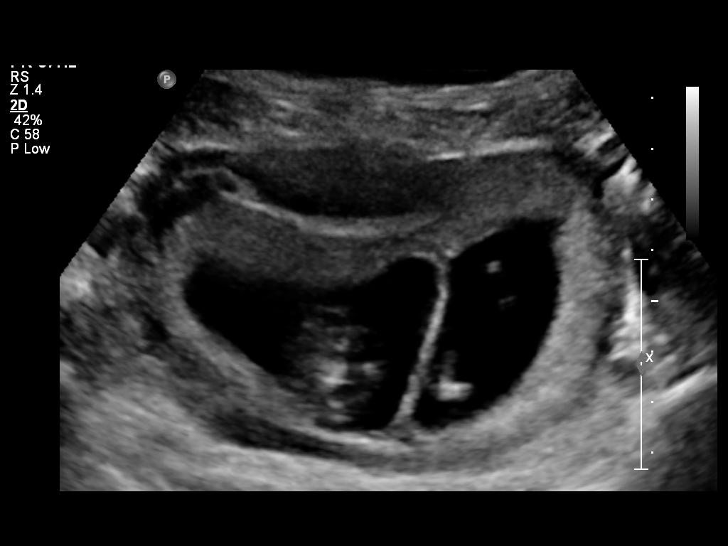
[im 12/24]
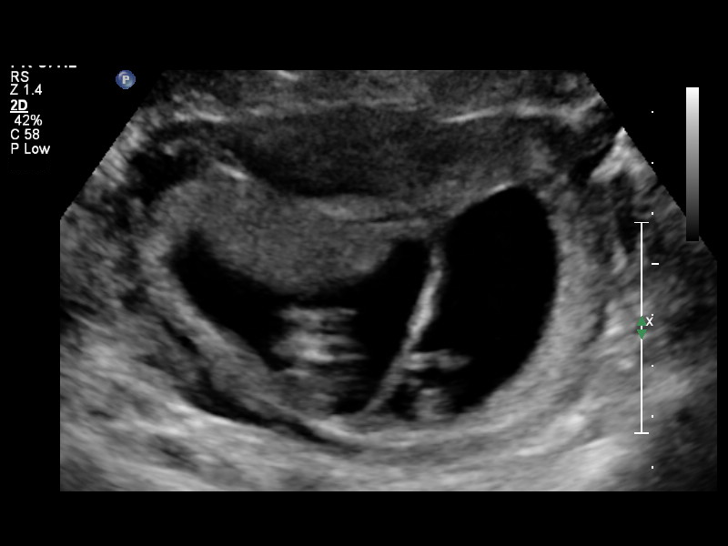
[im 13/24]
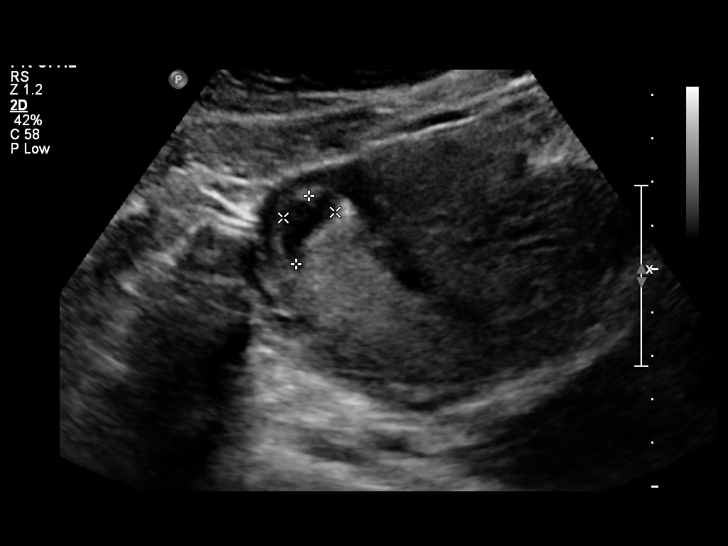
[im 15/24]
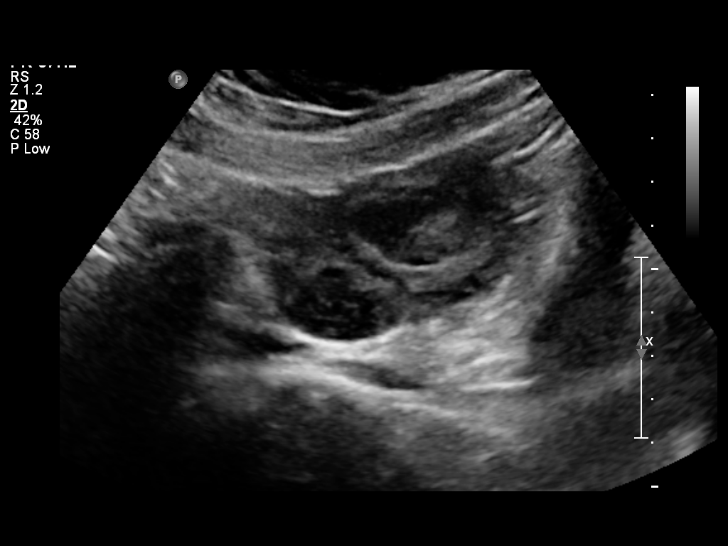
[im 17/24]
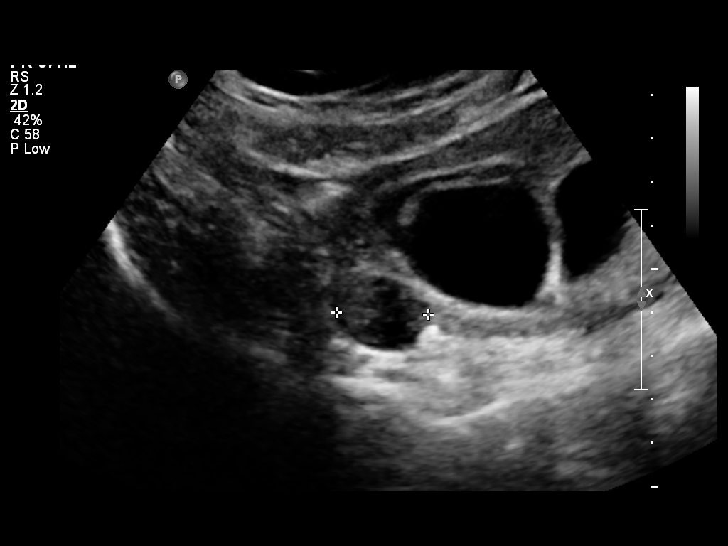
[im 19/24]
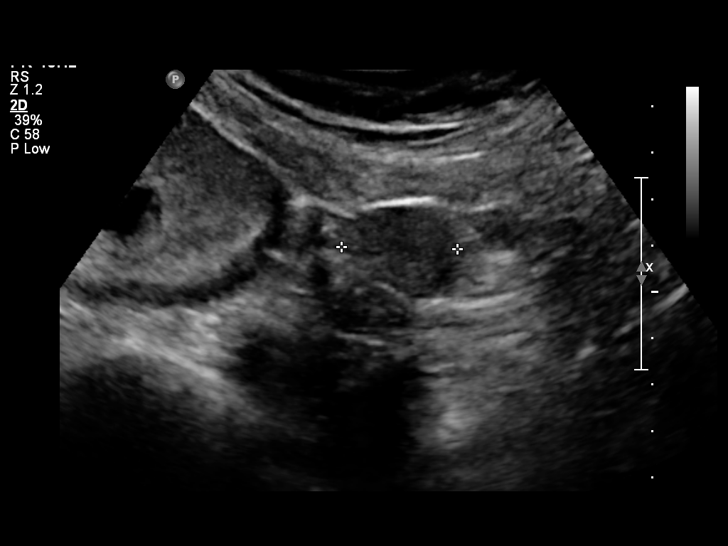
[im 20/24]
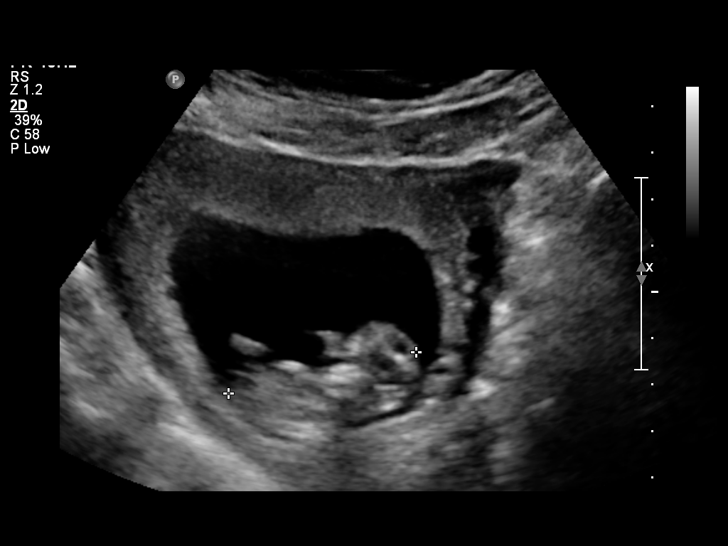
[im 22/24]
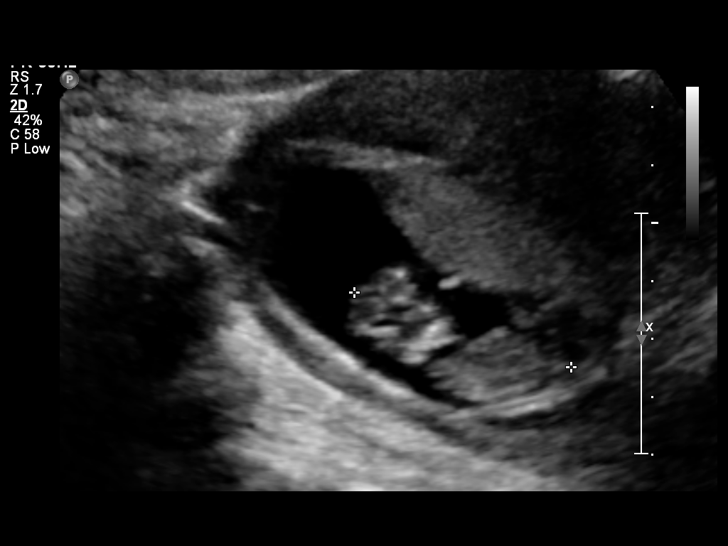
[im 24/24]
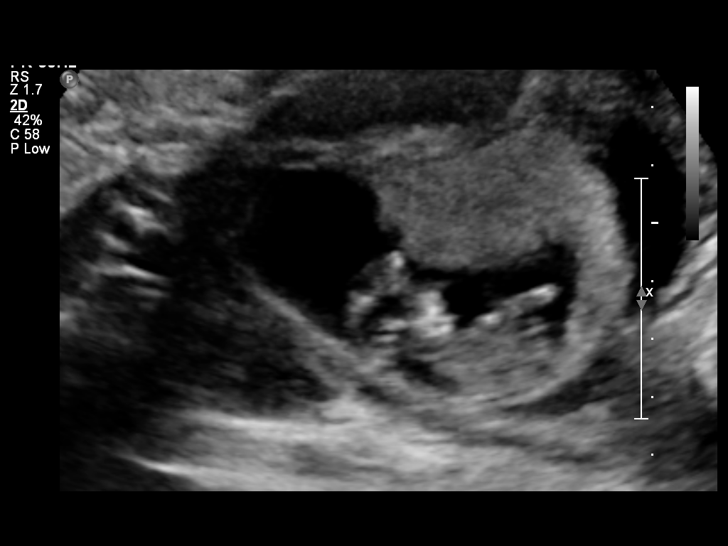

[14 of 24 positions shown; findings below may reference images not displayed]

Intrauterine gestational sac:  Twin gestation with two normal
appearing gestational sacs.
Yolk sac: Negative
Embryo: Both fetuses visible
Cardiac Activity: Present in both fetuses.
Heart Rate: Twin A 183 beats per minute.  Twin B 172 bpm

CRL: A: 40.4   mm  11   w  zero   d

          B: 39.8 mm 11 weeks 0 days

Maternal uterus/adnexae:
No subchorionic hemorrhage.

Normal appearing myometrium.

Both ovaries appear normal with the right measuring 2.7 x 1.7 x
cm in the left measuring 3.4 x 2.0 x 2.5 cm.
IMPRESSION: Normal appearing twin gestation at 11 weeks 0 days

## 2012-02-16 ENCOUNTER — Ambulatory Visit: Payer: Self-pay | Admitting: Obstetrics and Gynecology

## 2012-08-30 ENCOUNTER — Ambulatory Visit: Payer: Self-pay | Admitting: Obstetrics and Gynecology

## 2012-09-11 DIAGNOSIS — D573 Sickle-cell trait: Secondary | ICD-10-CM | POA: Insufficient documentation

## 2012-09-14 ENCOUNTER — Encounter: Payer: Self-pay | Admitting: Obstetrics and Gynecology

## 2012-09-14 ENCOUNTER — Ambulatory Visit (INDEPENDENT_AMBULATORY_CARE_PROVIDER_SITE_OTHER): Payer: PRIVATE HEALTH INSURANCE | Admitting: Obstetrics and Gynecology

## 2012-09-14 VITALS — BP 120/76 | Ht 66.0 in | Wt 209.0 lb

## 2012-09-14 DIAGNOSIS — Z01419 Encounter for gynecological examination (general) (routine) without abnormal findings: Secondary | ICD-10-CM

## 2012-09-14 DIAGNOSIS — N920 Excessive and frequent menstruation with regular cycle: Secondary | ICD-10-CM

## 2012-09-14 LAB — CBC
HCT: 35.9 % — ABNORMAL LOW (ref 36.0–46.0)
Hemoglobin: 12.1 g/dL (ref 12.0–15.0)
MCH: 25.4 pg — ABNORMAL LOW (ref 26.0–34.0)
MCHC: 33.7 g/dL (ref 30.0–36.0)
MCV: 75.4 fL — ABNORMAL LOW (ref 78.0–100.0)
Platelets: 340 10*3/uL (ref 150–400)
RBC: 4.76 MIL/uL (ref 3.87–5.11)
RDW: 15.7 % — ABNORMAL HIGH (ref 11.5–15.5)
WBC: 5.5 10*3/uL (ref 4.0–10.5)

## 2012-09-14 MED ORDER — MEDROXYPROGESTERONE ACETATE 10 MG PO TABS: ORAL_TABLET | ORAL | Status: DC

## 2012-09-14 NOTE — Progress Notes (Signed)
The patient reports:she complains of heavy cycles with btl   Contraception:bilateral tubal ligation  Last mammogram: patient has never had a mammogram  Last pap: was abnormal: ascus hpv dectected March  2012  GC/Chlamydia cultures offered: declined HIV/RPR/HbsAg offered:  declined HSV 1 and 2 glycoprotein offered: declined  Menstrual cycle regular and monthly: Yes Menstrual flow normal: No: Heavy flow   Urinary symptoms: none Normal bowel movements: Yes Reports abuse at home: No:  Pt stated cycles are heavy after getting her BTL   Subjective:    [name] is a 26 y.o. female, G5P4, who presents for an annual exam.     History   Social History  . Marital Status: Married    Spouse Name: N/A    Number of Children: N/A  . Years of Education: N/A   Social History Main Topics  . Smoking status: Never Smoker   . Smokeless tobacco: Never Used  . Alcohol Use: No  . Drug Use: No  . Sexually Active: Yes    Birth Control/ Protection: Surgical     BTL   Other Topics Concern  . None   Social History Narrative  . None    Menstrual cycle:   LMP: Patient's last menstrual period was 08/28/2012.           Cycle: monthly, 5 days, 3 heavy days requiring 1 tampon and 1 pad every 4 hours, no clots. No dysmenorrhea.No BTB. Thyroid last checked July 2013 by PCP.  The following portions of the patient's history were reviewed and updated as appropriate: allergies, current medications, past family history, past medical history, past social history, past surgical history and problem list.  Review of Systems Pertinent items are noted in HPI. Breast:Negative for breast lump,nipple discharge or nipple retraction Gastrointestinal: Negative for abdominal pain, change in bowel habits or rectal bleeding Urinary:negative   Objective:    BP 120/76  Ht 5\' 6"  (1.676 m)  Wt 209 lb (94.802 kg)  BMI 33.73 kg/m2  LMP 08/28/2012  Breastfeeding? No    Weight:  Wt Readings from Last 1  Encounters:  09/14/12 209 lb (94.802 kg)          BMI: Body mass index is 33.73 kg/(m^2).  General Appearance: Alert, appropriate appearance for age. No acute distress HEENT: Grossly normal Neck / Thyroid: Supple, no masses, nodes or enlargement Lungs: clear to auscultation bilaterally Back: No CVA tenderness Breast Exam: No masses or nodes.No dimpling, nipple retraction or discharge. Cardiovascular: Regular rate and rhythm. S1, S2, no murmur Gastrointestinal: Soft, non-tender, no masses or organomegaly Pelvic Exam: Vulva and vagina appear normal. Bimanual exam reveals normal uterus and adnexa. Rectovaginal: not indicated Lymphatic Exam: Non-palpable nodes in neck, clavicular, axillary, or inguinal regions Skin: no rash or abnormalities Neurologic: Normal gait and speech, no tremor  Psychiatric: Alert and oriented, appropriate affect.    Assessment:    Normal gyn exam menorrhagia    Plan:    CBC Reviewed options: expectant, BCP, cyclic Provera, Depo-Provera and Novasure ( not recommended at this young age) Will try Provera 10 mg d16-25 pap smear return in 4 months STD screening: declined Contraception:bilateral tubal ligation Also reviewed decreased libido with multifactorial cause in a young woman who is raising 3 kids, going to school full time with lots of stressors. Suggest "Hot Monogamy"   Silverio Lay MD

## 2012-09-18 LAB — PAP IG W/ RFLX HPV ASCU

## 2012-09-21 ENCOUNTER — Telehealth: Payer: Self-pay | Admitting: Obstetrics and Gynecology

## 2012-09-21 DIAGNOSIS — N926 Irregular menstruation, unspecified: Secondary | ICD-10-CM

## 2012-09-21 MED ORDER — MEDROXYPROGESTERONE ACETATE 10 MG PO TABS: ORAL_TABLET | ORAL | Status: DC

## 2012-09-21 NOTE — Telephone Encounter (Deleted)
Pt called, saw SR last Thursday, was to have Progesterone filled @ [facility] [address], they have not received rx yet.

## 2012-09-22 ENCOUNTER — Other Ambulatory Visit: Payer: Self-pay | Admitting: Obstetrics and Gynecology

## 2012-09-22 ENCOUNTER — Other Ambulatory Visit: Payer: Self-pay

## 2012-09-22 DIAGNOSIS — N926 Irregular menstruation, unspecified: Secondary | ICD-10-CM

## 2012-09-22 MED ORDER — MEDROXYPROGESTERONE ACETATE 10 MG PO TABS: ORAL_TABLET | ORAL | Status: AC

## 2014-09-30 ENCOUNTER — Encounter: Payer: Self-pay | Admitting: Obstetrics and Gynecology

## 2017-09-06 ENCOUNTER — Encounter (HOSPITAL_COMMUNITY): Payer: Self-pay | Admitting: Emergency Medicine

## 2017-09-06 ENCOUNTER — Emergency Department (HOSPITAL_COMMUNITY): Payer: Self-pay

## 2017-09-06 DIAGNOSIS — R51 Headache: Secondary | ICD-10-CM | POA: Insufficient documentation

## 2017-09-06 DIAGNOSIS — R0789 Other chest pain: Secondary | ICD-10-CM | POA: Insufficient documentation

## 2017-09-06 LAB — CBC
HCT: 34.5 % — ABNORMAL LOW (ref 36.0–46.0)
Hemoglobin: 11.5 g/dL — ABNORMAL LOW (ref 12.0–15.0)
MCH: 25.3 pg — ABNORMAL LOW (ref 26.0–34.0)
MCHC: 33.3 g/dL (ref 30.0–36.0)
MCV: 75.8 fL — AB (ref 78.0–100.0)
PLATELETS: 299 10*3/uL (ref 150–400)
RBC: 4.55 MIL/uL (ref 3.87–5.11)
RDW: 14.7 % (ref 11.5–15.5)
WBC: 7.1 10*3/uL (ref 4.0–10.5)

## 2017-09-06 LAB — BASIC METABOLIC PANEL
Anion gap: 8 (ref 5–15)
BUN: 10 mg/dL (ref 6–20)
CALCIUM: 10.9 mg/dL — AB (ref 8.9–10.3)
CHLORIDE: 104 mmol/L (ref 101–111)
CO2: 24 mmol/L (ref 22–32)
CREATININE: 0.96 mg/dL (ref 0.44–1.00)
Glucose, Bld: 107 mg/dL — ABNORMAL HIGH (ref 65–99)
Potassium: 3.3 mmol/L — ABNORMAL LOW (ref 3.5–5.1)
SODIUM: 136 mmol/L (ref 135–145)

## 2017-09-06 LAB — I-STAT TROPONIN, ED: TROPONIN I, POC: 0 ng/mL (ref 0.00–0.08)

## 2017-09-06 NOTE — ED Triage Notes (Signed)
Patient reports left chest pain with brief right arm weakness and headache  onset Sunday , denies SOB , no nausea or diaphoresis .

## 2017-09-07 ENCOUNTER — Emergency Department (HOSPITAL_COMMUNITY)
Admission: EM | Admit: 2017-09-07 | Discharge: 2017-09-07 | Disposition: A | Discharge status: Home / Self Care | Payer: Self-pay | Attending: Emergency Medicine | Admitting: Emergency Medicine

## 2017-09-07 DIAGNOSIS — R0789 Other chest pain: Secondary | ICD-10-CM

## 2017-09-07 DIAGNOSIS — R519 Headache, unspecified: Secondary | ICD-10-CM

## 2017-09-07 DIAGNOSIS — R51 Headache: Secondary | ICD-10-CM

## 2017-09-07 NOTE — ED Provider Notes (Signed)
[facility]-EMERGENCY DEPT Provider Note   CSN: [id number] Arrival date & time: 09/06/17  2145     History   Chief Complaint Chief Complaint  Patient presents with  . Chest Pain    HPI [name] is a 31 y.o. female.  Patient presents to the emergency department for evaluation of multiple problems. Patient reports that she has been experiencing left sided chest pain for 2 days. Pain has been continuous. She reports that she has had similar problems to this in the past. Usually lasts for a couple of days and then resolves. Patient reports that a coworker took her blood pressure earlier and it was elevated, but she does not have a history of hypertension. There is no family history of heart disease. She does not have diabetes or high cholesterol. Patient does not report any shortness of breath associated with chest pain.  Patient also concerned about headache. This has been ongoing for 2 days as well. She reports that she does have intermittent headaches at times, but usually they do not last as long as this. She has not had any visual disturbance, difficulty with speech with her headache. She did, however, have an episode 2 days ago for the right arm felt somewhat heavy. This started around 9:30 at night and when she woke up the next morning it was resolved. She never noticed any clumsiness or actual weakness. This has not reoccurred.      Past Medical History:  Diagnosis Date  . Hyperthyroidism   . Sickle cell trait [facility])     Patient Active Problem List   Diagnosis Date Noted  . Sickle cell trait (HCC)   . HYPERTHYROIDISM 08/25/2010    Past Surgical History:  Procedure Laterality Date  . CESAREAN SECTION    . TUBAL LIGATION      OB History    Gravida Para Term Preterm AB Living   SAB TAB Ectopic Multiple Live Births         1 1       Home Medications    Prior to Admission medications   Medication Sig Start Date End Date Taking? Authorizing Provider    ibuprofen (ADVIL,MOTRIN) 200 MG tablet Take 200 mg by mouth every 6 (six) hours as needed for fever, headache or mild pain.   Yes [provider]  medroxyPROGESTERone (PROVERA) 10 MG tablet Take one tablet at night day 16-25 Patient not taking: Reported on 09/07/2017 09/22/12   Silverio Lay, MD    Family History Family History  Problem Relation Age of Onset  . Diabetes Father   . Hyperlipidemia Maternal Grandmother   . Diabetes Maternal Grandfather   . Heart disease Maternal Uncle   . Diabetes Maternal Uncle     Social History Social History  Substance Use Topics  . Smoking status: Never Smoker  . Smokeless tobacco: Never Used  . Alcohol use No     Allergies   Patient has no known allergies.   Review of Systems Review of Systems  Respiratory: Negative for shortness of breath.   Cardiovascular: Positive for chest pain.  Neurological: Positive for headaches.  All other systems reviewed and are negative.    Physical Exam Updated Vital Signs BP (!) 150/91   Pulse 97   Temp 98.2 F (36.8 C) (Oral)   Resp 18   LMP 08/19/2017 (Approximate)   SpO2 100%   Physical Exam  Constitutional: She is oriented to person,  place, and time. She appears well-developed and well-nourished. No distress.  HENT:  Head: Normocephalic and atraumatic.  Right Ear: Hearing normal.  Left Ear: Hearing normal.  Nose: Nose normal.  Mouth/Throat: Oropharynx is clear and moist and mucous membranes are normal.  Eyes: Pupils are equal, round, and reactive to light. Conjunctivae and EOM are normal.  Neck: Normal range of motion. Neck supple.  Cardiovascular: Regular rhythm, S1 normal and S2 normal.  Exam reveals no gallop and no friction rub.   No murmur heard. Pulmonary/Chest: Effort normal and breath sounds normal. No respiratory distress. She exhibits no tenderness.  Abdominal: Soft. Normal appearance and bowel sounds are normal. There is no hepatosplenomegaly. There is no  tenderness. There is no rebound, no guarding, no tenderness at McBurney's point and negative Murphy's sign. No hernia.  Musculoskeletal: Normal range of motion.  Neurological: She is alert and oriented to person, place, and time. She has normal strength. No cranial nerve deficit or sensory deficit. Coordination normal. GCS eye subscore is 4. GCS verbal subscore is 5. GCS motor subscore is 6.  Extraocular muscle movement: normal No visual field cut Pupils: equal and reactive both direct and consensual response is normal No nystagmus present    Sensory function is intact to light touch, pinprick Proprioception intact  Grip strength 5/5 symmetric in upper extremities No pronator drift Normal finger to nose bilaterally  Lower extremity strength 5/5 against gravity Normal heel to shin bilaterally  Gait: normal   Skin: Skin is warm, dry and intact. No rash noted. No cyanosis.  Psychiatric: She has a normal mood and affect. Her speech is normal and behavior is normal. Thought content normal.  Nursing note and vitals reviewed.    ED Treatments / Results  Labs (all labs ordered are listed, but only abnormal results are displayed) Labs Reviewed  BASIC METABOLIC PANEL - Abnormal; Notable for the following:       Result Value   Potassium 3.3 (*)    Glucose, Bld 107 (*)    Calcium 10.9 (*)    All other components within normal limits  CBC - Abnormal; Notable for the following:    Hemoglobin 11.5 (*)    HCT 34.5 (*)    MCV 75.8 (*)    MCH 25.3 (*)    All other components within normal limits  I-STAT TROPONIN, ED    EKG  EKG Interpretation  Date/Time:  Tuesday September 06 2017 21:52:28 EDT Ventricular Rate:  88 PR Interval:  160 QRS Duration: 98 QT Interval:  334 QTC Calculation: 404 R Axis:   65 Text Interpretation:  Normal sinus rhythm with sinus arrhythmia Nonspecific T wave abnormality Abnormal ECG No significant change since last tracing Confirmed by Gilda Crease  [phone]) on 09/07/2017 1:00:31 AM       Radiology Dg Chest 2 View  Result Date: 09/06/2017 CLINICAL DATA:  Chest tightness and right upper extremity paresthesias for 2 days. EXAM: CHEST  2 VIEW COMPARISON:  10/17/2004 FINDINGS: The lungs are clear. The pulmonary vasculature is normal. Heart size is normal. Hilar and mediastinal contours are unremarkable. There is no pleural effusion. IMPRESSION: No active cardiopulmonary disease. Electronically Signed   By: Ellery Plunk M.D.   On: 09/06/2017 22:43    Procedures Procedures (including critical care time)  Medications Ordered in ED Medications - No data to display   Initial Impression / Assessment and Plan / ED Course  I have reviewed the triage vital signs and the nursing notes.  Pertinent  labs & imaging results that were available during my care of the patient were reviewed by me and considered in my medical decision making (see chart for details).     Patient presents to the emergency department for multiple problems. Patient has been expressing chest pain for 2 days. She has no cardiac risk factors other than being overweight. Her blood pressure was elevated today but she does not have a history of this. This is likely secondary to the stress of coming to the ER and her headache. Her EKG does show nonspecific changes but this is unchanged since 2005. She does not have shortness of breath or hypoxia, no risk factors for PE and no symptoms that would suggest PE. Troponin was negative.  Patient complaining of headache. She also reports that she had numbness and possibly weakness of her right arm for 2 days ago. This lasted for several hours but then was resolved when she woke up. Cannot rule out TIA although it might have been peripheral as well. Patient declined head CT based on cost purposes. Her symptoms have resolved 2 days ago and have not returned. I also talked to her about head CT for her headache. This was not a thunderclap  headache and she has an entirely normal neurologic exam. After discussing risks and benefits she has declined head CT. She will therefore be referred to neurology for outpatient follow-up, told to return to the ER for any recurrent neurologic symptoms or worsening headache.  Final Clinical Impressions(s) / ED Diagnoses   Final diagnoses:  Atypical chest pain  Acute nonintractable headache, unspecified headache type    New Prescriptions New Prescriptions   No medications on file     Gilda Crease, MD 09/07/17 [phone]

## 2018-10-25 ENCOUNTER — Ambulatory Visit: Payer: Self-pay | Admitting: Cardiology

## 2018-12-22 ENCOUNTER — Ambulatory Visit: Payer: Self-pay | Admitting: Cardiovascular Disease

## 2019-04-27 IMAGING — DX DG CHEST 2V
2 series · 2 of 2 positions shown · non-contrast
Comparison: 10/17/2004

CLINICAL DATA: Chest tightness and right upper extremity
paresthesias for 2 days.

EXAM:
CHEST  2 VIEW

[chest pa]
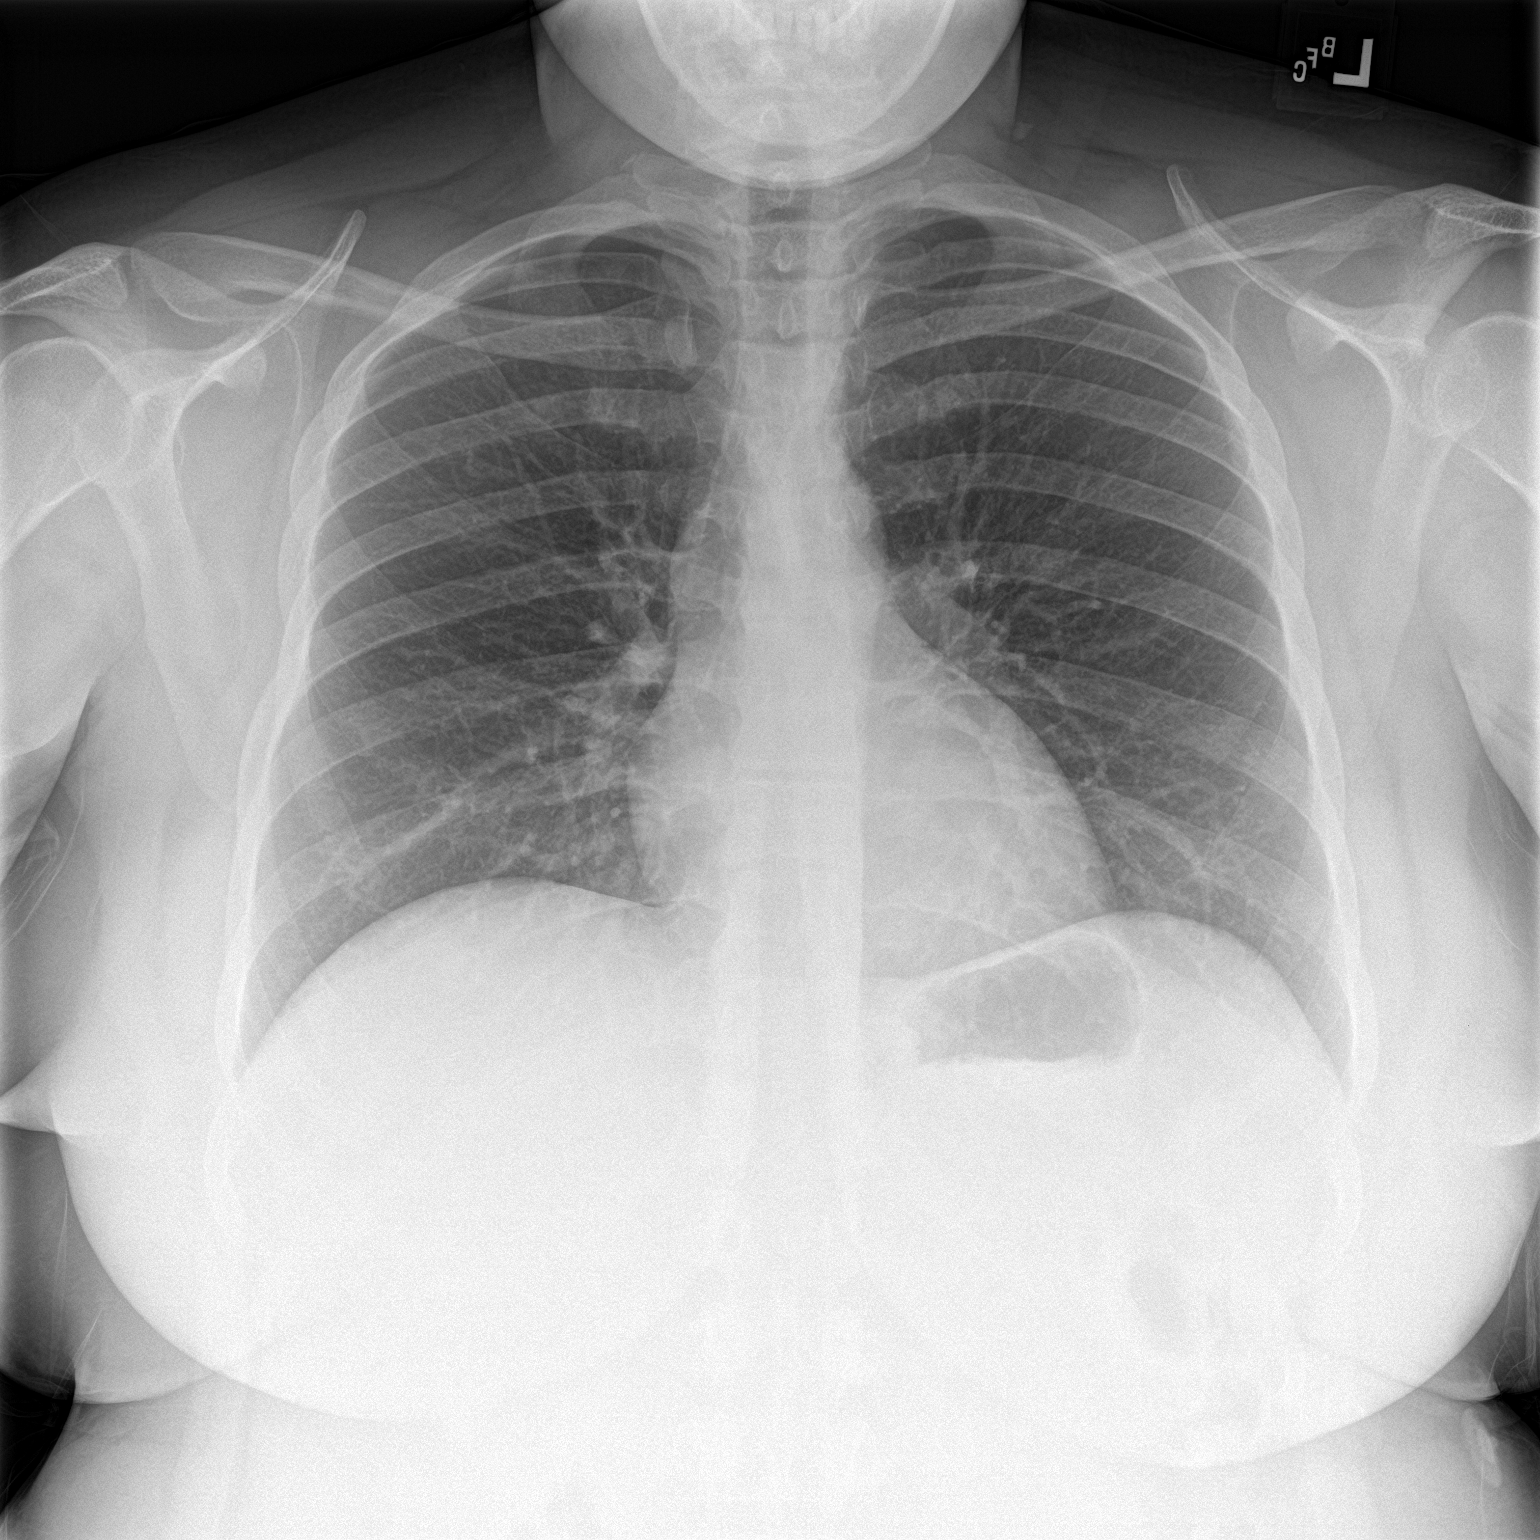

[chest lat]
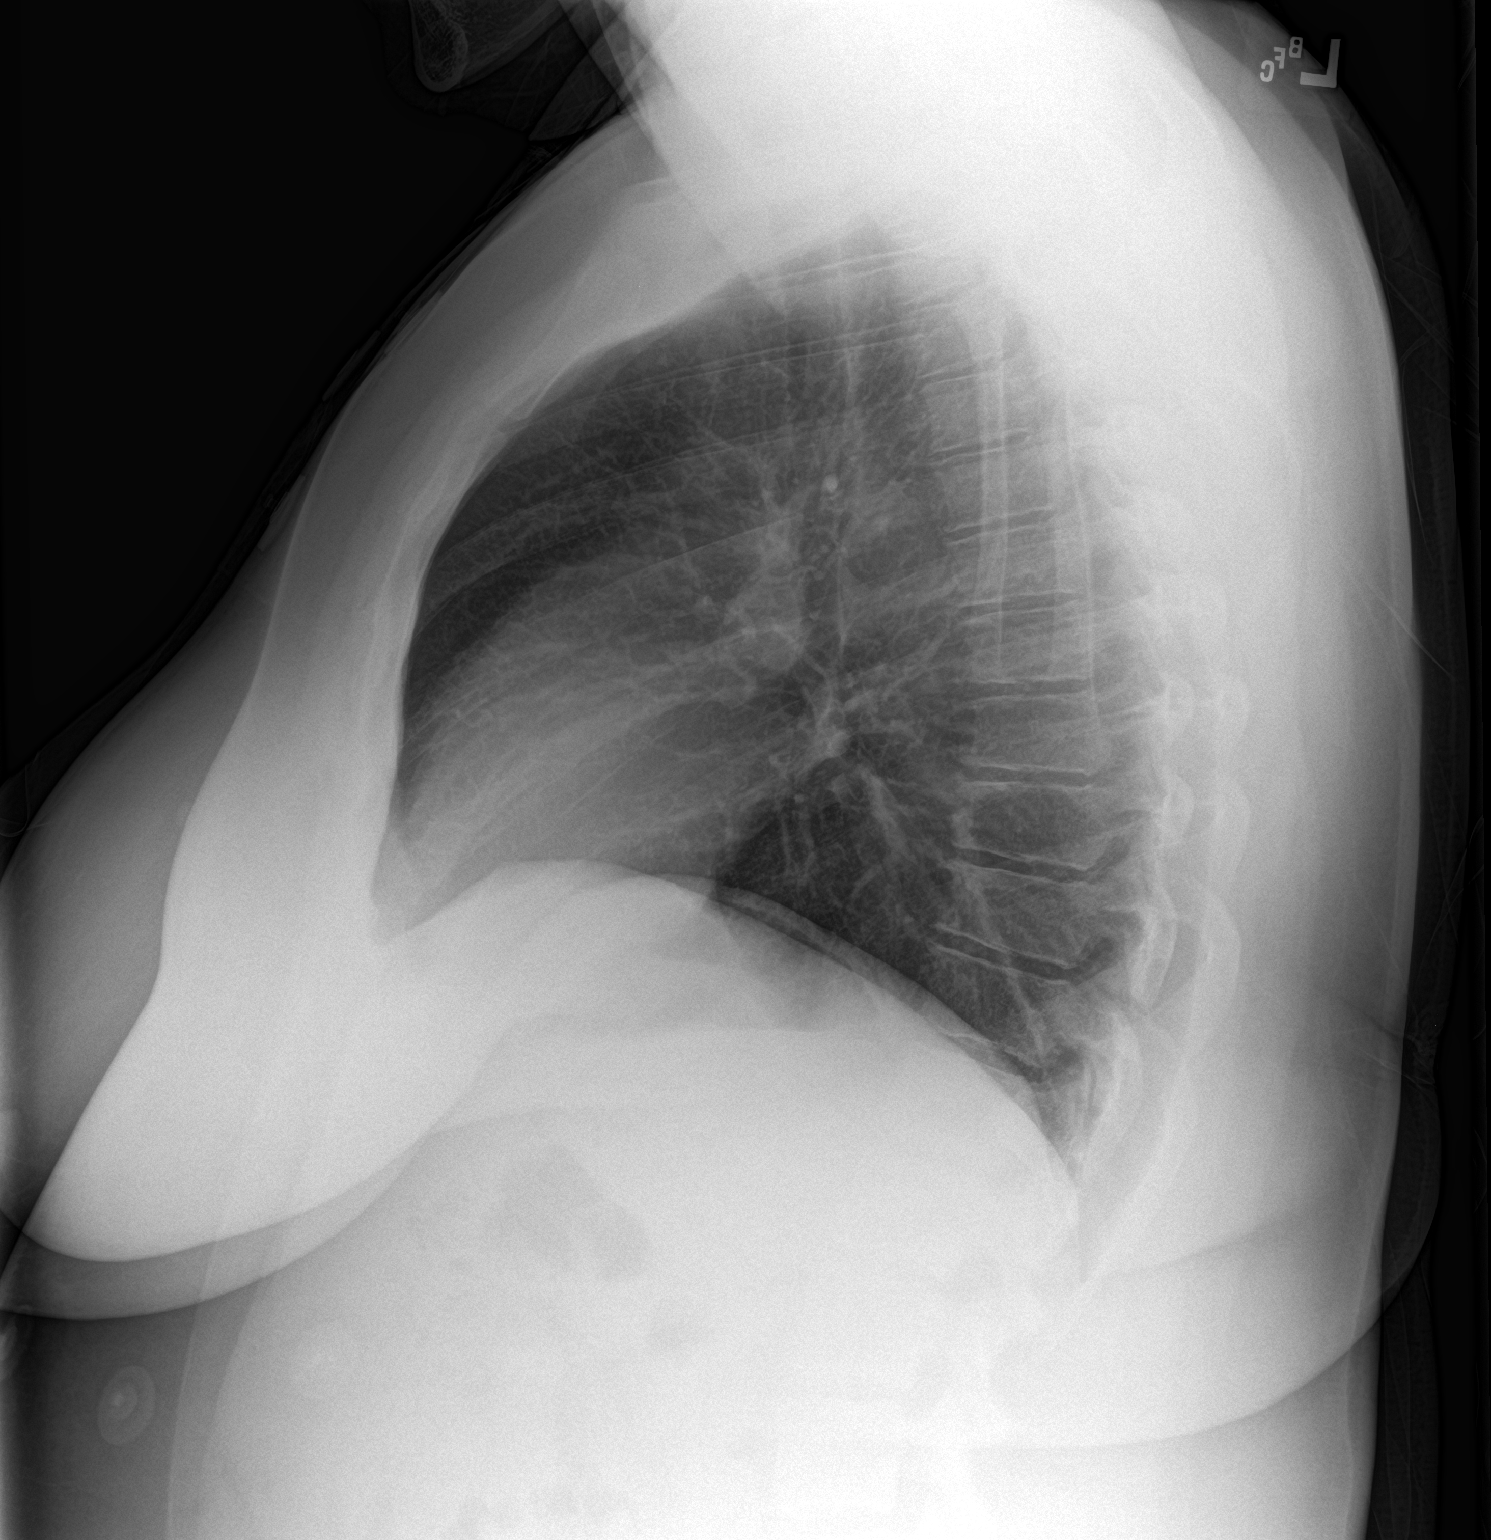

[2 of 2 positions shown; findings below may reference images not displayed]

FINDINGS: The lungs are clear. The pulmonary vasculature is normal. Heart size
is normal. Hilar and mediastinal contours are unremarkable. There is
no pleural effusion.
IMPRESSION: No active cardiopulmonary disease.

## 2022-12-24 DIAGNOSIS — E559 Vitamin D deficiency, unspecified: Secondary | ICD-10-CM | POA: Diagnosis not present

## 2022-12-24 DIAGNOSIS — E538 Deficiency of other specified B group vitamins: Secondary | ICD-10-CM | POA: Diagnosis not present

## 2022-12-24 DIAGNOSIS — E0590 Thyrotoxicosis, unspecified without thyrotoxic crisis or storm: Secondary | ICD-10-CM | POA: Diagnosis not present

## 2022-12-24 DIAGNOSIS — D509 Iron deficiency anemia, unspecified: Secondary | ICD-10-CM | POA: Diagnosis not present
